# Patient Record
Sex: Female | Born: 2000 | Race: White | Hispanic: No | Marital: Single | Smoking: Current every day smoker
Health system: Southern US, Community
[De-identification: ages and names within clinical notes are randomized; demographics above are authoritative.]

## PROBLEM LIST (undated history)

## (undated) DIAGNOSIS — Z789 Other specified health status: Secondary | ICD-10-CM

## (undated) HISTORY — DX: Other specified health status: Z78.9

## (undated) HISTORY — PX: WISDOM TOOTH EXTRACTION: SHX21

---
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
## 2018-04-11 ENCOUNTER — Other Ambulatory Visit: Payer: Self-pay

## 2018-04-11 ENCOUNTER — Emergency Department: Payer: Medicaid Other

## 2018-04-11 ENCOUNTER — Encounter: Payer: Self-pay | Admitting: Emergency Medicine

## 2018-04-11 ENCOUNTER — Emergency Department
Admission: EM | Admit: 2018-04-11 | Discharge: 2018-04-12 | Disposition: A | Discharge status: Home / Self Care | Payer: Medicaid Other | Attending: Emergency Medicine | Admitting: Emergency Medicine

## 2018-04-11 DIAGNOSIS — M940 Chondrocostal junction syndrome [Tietze]: Secondary | ICD-10-CM

## 2018-04-11 DIAGNOSIS — R0789 Other chest pain: Secondary | ICD-10-CM | POA: Insufficient documentation

## 2018-04-11 DIAGNOSIS — R509 Fever, unspecified: Secondary | ICD-10-CM | POA: Diagnosis not present

## 2018-04-11 DIAGNOSIS — Z79899 Other long term (current) drug therapy: Secondary | ICD-10-CM | POA: Diagnosis not present

## 2018-04-11 DIAGNOSIS — R079 Chest pain, unspecified: Secondary | ICD-10-CM | POA: Diagnosis present

## 2018-04-11 LAB — CBC
HEMATOCRIT: 37 % (ref 36.0–46.0)
Hemoglobin: 13.4 g/dL (ref 12.0–15.0)
MCH: 30 pg (ref 26.0–34.0)
MCHC: 36.2 g/dL — AB (ref 30.0–36.0)
MCV: 83 fL (ref 80.0–100.0)
Platelets: 251 10*3/uL (ref 150–400)
RBC: 4.46 MIL/uL (ref 3.87–5.11)
RDW: 11.3 % — ABNORMAL LOW (ref 11.5–15.5)
WBC: 8.8 10*3/uL (ref 4.0–10.5)
nRBC: 0 % (ref 0.0–0.2)

## 2018-04-11 LAB — POCT PREGNANCY, URINE: Preg Test, Ur: NEGATIVE

## 2018-04-11 LAB — BASIC METABOLIC PANEL
ANION GAP: 9 (ref 5–15)
BUN: 13 mg/dL (ref 6–20)
CALCIUM: 9 mg/dL (ref 8.9–10.3)
CO2: 23 mmol/L (ref 22–32)
Chloride: 102 mmol/L (ref 98–111)
Creatinine, Ser: 0.76 mg/dL (ref 0.44–1.00)
GFR calc Af Amer: >60 mL/min (ref >60)
GFR calc non Af Amer: >60 mL/min (ref >60)
GLUCOSE: 107 mg/dL — AB (ref 70–99)
POTASSIUM: 3.7 mmol/L (ref 3.5–5.1)
Sodium: 134 mmol/L — ABNORMAL LOW (ref 135–145)

## 2018-04-11 LAB — TROPONIN I: Troponin I: <0.03 ng/mL

## 2018-04-11 MED ORDER — IOHEXOL 350 MG/ML SOLN
100.0000 mL | Freq: Once | INTRAVENOUS | Status: AC | PRN
  Administered 2018-04-11: 100 mL via INTRAVENOUS

## 2018-04-11 MED ORDER — SODIUM CHLORIDE 0.9 % IV BOLUS
1000.0000 mL | Freq: Once | INTRAVENOUS | Status: AC
  Administered 2018-04-12: 1000 mL via INTRAVENOUS

## 2018-04-11 MED ORDER — ACETAMINOPHEN 325 MG PO TABS
650.0000 mg | ORAL_TABLET | Freq: Once | ORAL | Status: AC | PRN
  Administered 2018-04-11: 650 mg via ORAL
  Filled 2018-04-11: qty 2

## 2018-04-11 MED ORDER — IOPAMIDOL (ISOVUE-370) INJECTION 76%: 100.0000 mL | Freq: Once | INTRAVENOUS | Status: DC | PRN

## 2018-04-11 NOTE — ED Notes (Signed)
Pt is going to medical imaging.   

## 2018-04-11 NOTE — ED Provider Notes (Signed)
[facility] Emergency Department Provider Note   ____________________________________________   First MD Initiated Contact with Patient 04/11/18 2305     (approximate)  I have reviewed the triage vital signs and the nursing notes.   HISTORY  Chief Complaint Chest Pain and Shortness of Breath    HPI Amanda Austin is a 17 y.o. female who presents to the ED from home with a chief complaint of chest pain and shortness of breath.  Patient reports left shoulder blade pain last night which radiated to her right shoulder.  Tonight she developed central chest pain worsened by deep breathing.  Today also developed low-grade fever.  Has had some coughing productive of yellow sputum.  Denies associated headache, vision changes, neck pain, abdominal pain, nausea, vomiting, dysuria, diarrhea.  Denies recent travel, trauma or OCP use.  Family history of blood clots.   Past medical history None  There are no active problems to display for this patient.   History reviewed. No pertinent surgical history.  Prior to Admission medications   Medication Sig Start Date End Date Taking? Authorizing Provider  naproxen (NAPROSYN) 500 MG tablet Take 1 tablet (500 mg total) by mouth 2 (two) times daily with a meal. 07/11/17   Triplett, Cari B, FNP  nitrofurantoin, macrocrystal-monohydrate, (MACROBID) 100 MG capsule Take 1 capsule (100 mg total) by mouth 2 (two) times daily. 12/05/17   Fisher, Roselyn Bering, PA-C  traMADol (ULTRAM) 50 MG tablet Take 1 tablet (50 mg total) by mouth every 6 (six) hours as needed. 07/11/17   Chinita Pester, FNP    Allergies Rocephin [ceftriaxone]  No family history on file.  Social History Social History   Tobacco Use  . Smoking status: Never Smoker  . Smokeless tobacco: Never Used  Substance Use Topics  . Alcohol use: Not on file  . Drug use: Not on file    Review of Systems  Constitutional: Positive for fever. Eyes: No visual  changes. ENT: No sore throat. Cardiovascular: Positive for chest pain. Respiratory: Positive for shortness of breath. Gastrointestinal: No abdominal pain.  No nausea, no vomiting.  No diarrhea.  No constipation. Genitourinary: Negative for dysuria. Musculoskeletal: Negative for back pain. Skin: Negative for rash. Neurological: Negative for headaches, focal weakness or numbness.   ____________________________________________   PHYSICAL EXAM:  VITAL SIGNS: ED Triage Vitals  Enc Vitals Group     BP 04/11/18 2239 127/67     Pulse Rate 04/11/18 2239 (!) 124     Resp 04/11/18 2239 20     Temp 04/11/18 2239 (!) 100.6 F (38.1 C)     Temp Source 04/11/18 2239 Oral     SpO2 04/11/18 2239 98 %     Weight 04/11/18 2233 127 lb (57.6 kg)     Height 04/11/18 2233 5\' 4"  (1.626 m)     Head Circumference --      Peak Flow --      Pain Score 04/11/18 2233 6     Pain Loc --      Pain Edu? --      Excl. in GC? --     Constitutional: Alert and oriented. Well appearing and in no acute distress.  Visiting and laughing with her mother. Eyes: Conjunctivae are normal. PERRL. EOMI. Head: Atraumatic. Nose: No congestion/rhinnorhea. Mouth/Throat: Mucous membranes are moist.  Oropharynx non-erythematous. Neck: No stridor.  Supple neck without meningismus. Hematological/Lymphatic/Immunilogical: No cervical lymphadenopathy. Cardiovascular: Tachycardic rate, regular rhythm. Grossly normal heart sounds.  Good peripheral circulation.  Respiratory: Normal respiratory effort.  No retractions. Lungs CTAB.  Anterior chest tender to palpation. Gastrointestinal: Soft and nontender to light or deep palpation. No distention. No abdominal bruits. No CVA tenderness. Musculoskeletal: No lower extremity tenderness nor edema.  No joint effusions. Neurologic:  Normal speech and language. No gross focal neurologic deficits are appreciated. No gait instability. Skin:  Skin is warm, dry and intact. No rash noted.  No  petechiae. Psychiatric: Mood and affect are normal. Speech and behavior are normal.  ____________________________________________   LABS (all labs ordered are listed, but only abnormal results are displayed)  Labs Reviewed  BASIC METABOLIC PANEL - Abnormal; Notable for the following components:      Result Value   Sodium 134 (*)    Glucose, Bld 107 (*)    All other components within normal limits  CBC - Abnormal; Notable for the following components:   MCHC 36.2 (*)    RDW 11.3 (*)    All other components within normal limits  URINE DRUG SCREEN, QUALITATIVE ([facility] ONLY) - Abnormal; Notable for the following components:   Cannabinoid 50 Ng, Ur [place] POSITIVE (*)    All other components within normal limits  TROPONIN I  POC URINE PREG, ED  POCT PREGNANCY, URINE   ____________________________________________  EKG  ED ECG REPORT I, Capone Schwinn J, the attending physician, personally viewed and interpreted this ECG.   Date: 04/11/2018  EKG Time: 2238  Rate: 124  Rhythm: sinus tachycardia  Axis: Normal  Intervals:none  ST&T Change: Nonspecific No old EKG for comparison ____________________________________________  RADIOLOGY  ED MD interpretation: No active cardiopulmonary process; no PE  Official radiology report(s): Dg Chest 2 View  Result Date: 04/11/2018 CLINICAL DATA:  Left-sided rib pain last night and as well as left shoulder pain. EXAM: CHEST - 2 VIEW COMPARISON:  None. FINDINGS: The heart size and mediastinal contours are within normal limits. Both lungs are clear. No pulmonary consolidation, edema or pneumothorax. No pleural effusion. The visualized skeletal structures are unremarkable. IMPRESSION: No active cardiopulmonary disease. Electronically Signed   By: Tollie Eth M.D.   On: 04/11/2018 23:44   Ct Angio Chest Pe W/cm &/or Wo Cm  Result Date: 04/12/2018 CLINICAL DATA:  Left rib pain and left shoulder pain. Chest pain. Fever. EXAM: CT ANGIOGRAPHY CHEST WITH  CONTRAST TECHNIQUE: Multidetector CT imaging of the chest was performed using the standard protocol during bolus administration of intravenous contrast. Multiplanar CT image reconstructions and MIPs were obtained to evaluate the vascular anatomy. CONTRAST:  OMNIPAQUE IOHEXOL 350 MG/ML SOLN COMPARISON:  Chest radiograph 04/11/2018 FINDINGS: Cardiovascular: No filling defect is identified in the pulmonary arterial tree to suggest pulmonary embolus. No acute thoracic aortic findings. Mediastinum/Nodes: Anterior mediastinal density favoring residual thymic tissue. Left axillary node 0.8 cm in short axis, image 34/4. Right axillary node 0.7 cm in short axis, image 34/4. Right lower neck level IV lymph node 0.6 cm in short axis, image 6/4. No pathologic adenopathy is observed. Lungs/Pleura: Unremarkable Upper Abdomen: Potential prominence the upper spleen. Musculoskeletal: Unremarkable Review of the MIP images confirms the above findings. IMPRESSION: 1. No filling defect is identified in the pulmonary arterial tree to suggest pulmonary embolus. 2. A cause for patient's pleuritic left chest pain is not identified. 3. Borderline prominence the upper splenic contour. I recommend correlate palpation of the spleen to exclude splenomegaly. 4. Anterior mediastinal density in this case is most compatible with residual thymic tissue. 5. Small bilateral axillary lymph nodes are relatively symmetric. Electronically Signed  By: Gaylyn Rong M.D.   On: 04/12/2018 00:44    ____________________________________________   PROCEDURES  Procedure(s) performed: None  Procedures  Critical Care performed: No  ____________________________________________   INITIAL IMPRESSION / ASSESSMENT AND PLAN / ED COURSE  As part of my medical decision making, I reviewed the following data within the electronic MEDICAL RECORD NUMBER History obtained from family, Nursing notes reviewed and incorporated, Labs reviewed, EKG  interpreted, Old chart reviewed, Radiograph reviewed and Notes from prior ED visits   17 year old female who presents with low-grade fever, chest pain and shortness of breath. Differential diagnosis includes, but is not limited to, ACS, aortic dissection, pulmonary embolism, cardiac tamponade, pneumothorax, pneumonia, pericarditis, myocarditis, GI-related causes including esophagitis/gastritis, and musculoskeletal chest wall pain.    Laboratory and chest x-ray results unremarkable.  Given her family history of blood clots, will proceed with CT angios of her chest to evaluate for pulmonary embolus.  Tylenol given prior to my examination.  Clinical Course as of Apr 12 118  Fri Apr 12, 2018  0117 Patient voices no complaints of chest or back pain.  No splenomegaly on repeat abdominal exam.  Updated patient and her mother on CT imaging result.  Do not feel patient warrants repeat troponin as her symptoms have been ongoing for more than 24 hours.  Will discharge home on NSAIDs, Medrol Dosepak and patient will follow-up closely with her PCP.  Strict return precautions given.  Both verbalize understanding and agree with plan of care.   [JS]    Clinical Course User Index [JS] Irean Hong, MD     ____________________________________________   FINAL CLINICAL IMPRESSION(S) / ED DIAGNOSES  Final diagnoses:  Fever, unspecified fever cause  Atypical chest pain  Costochondritis     ED Discharge Orders    None       Note:  This document was prepared using Dragon voice recognition software and may include unintentional dictation errors.    Irean Hong, MD 04/12/18 [phone]

## 2018-04-11 NOTE — ED Triage Notes (Signed)
Patient reports left sided rib pain last night, as well as left sided shoulder pain. States pain traveled to right shoulder. Today developed chest pain, particularly with breathing. Patient reports fever earlier today (highest 100.5).

## 2018-04-12 LAB — URINE DRUG SCREEN, QUALITATIVE (ARMC ONLY)
Amphetamines, Ur Screen: NOT DETECTED
BARBITURATES, UR SCREEN: NOT DETECTED
BENZODIAZEPINE, UR SCRN: NOT DETECTED
CANNABINOID 50 NG, UR ~~LOC~~: POSITIVE — AB
Cocaine Metabolite,Ur ~~LOC~~: NOT DETECTED
MDMA (Ecstasy)Ur Screen: NOT DETECTED
Methadone Scn, Ur: NOT DETECTED
Opiate, Ur Screen: NOT DETECTED
Phencyclidine (PCP) Ur S: NOT DETECTED
TRICYCLIC, UR SCREEN: NOT DETECTED

## 2018-04-12 MED ORDER — METHYLPREDNISOLONE 4 MG PO TBPK: ORAL_TABLET | ORAL | 0 refills | Status: DC

## 2018-04-12 MED ORDER — IBUPROFEN 600 MG PO TABS: 600.0000 mg | ORAL_TABLET | Freq: Three times a day (TID) | ORAL | 0 refills | Status: DC | PRN

## 2018-04-12 NOTE — Discharge Instructions (Addendum)
1.  Take Medrol Dosepak as prescribed. 2.  Take Motrin as needed for chest discomfort. 3.  Apply moist heat to chest wall several times daily for comfort. 4.  Return to the ER for worsening symptoms, persistent vomiting, difficulty breathing or other concerns.

## 2018-07-26 ENCOUNTER — Emergency Department
Admission: EM | Admit: 2018-07-26 | Discharge: 2018-07-26 | Disposition: A | Discharge status: Home / Self Care | Payer: Medicaid Other | Attending: Emergency Medicine | Admitting: Emergency Medicine

## 2018-07-26 ENCOUNTER — Encounter: Payer: Self-pay | Admitting: Emergency Medicine

## 2018-07-26 ENCOUNTER — Other Ambulatory Visit: Payer: Self-pay

## 2018-07-26 ENCOUNTER — Emergency Department: Payer: Medicaid Other

## 2018-07-26 DIAGNOSIS — O200 Threatened abortion: Secondary | ICD-10-CM | POA: Diagnosis not present

## 2018-07-26 DIAGNOSIS — Z87891 Personal history of nicotine dependence: Secondary | ICD-10-CM | POA: Diagnosis not present

## 2018-07-26 DIAGNOSIS — Z79899 Other long term (current) drug therapy: Secondary | ICD-10-CM | POA: Insufficient documentation

## 2018-07-26 DIAGNOSIS — Z3A00 Weeks of gestation of pregnancy not specified: Secondary | ICD-10-CM | POA: Diagnosis not present

## 2018-07-26 LAB — POCT PREGNANCY, URINE: Preg Test, Ur: NEGATIVE

## 2018-07-26 LAB — HCG, QUANTITATIVE, PREGNANCY: hCG, Beta Chain, Quant, S: 11 m[IU]/mL — ABNORMAL HIGH (ref <5)

## 2018-07-26 LAB — ABO/RH: ABO/RH(D): A POS

## 2018-07-26 MED ORDER — BUTALBITAL-APAP-CAFFEINE 50-325-40 MG PO TABS
1.0000 | ORAL_TABLET | Freq: Once | ORAL | Status: AC
  Administered 2018-07-26: 1 via ORAL
  Filled 2018-07-26: qty 1

## 2018-07-26 NOTE — Discharge Instructions (Addendum)
As we discussed we think it is likely that you did have a miscarriage. However it is important that you follow up with an ob/gyn provider to have your blood level rechecked. Please seek medical care for any heavy prolonged bleeding, severe abdominal pain, shortness of breath or any other new or concerning symptoms.

## 2018-07-26 NOTE — ED Triage Notes (Signed)
Patient reports being pregnant at this time. Was at appointment this AM to see how far along she was and started bleeding. Patient left and came to ER

## 2018-07-26 NOTE — ED Notes (Signed)
Lab reported it should be about 5 more minutes before HCG pregnancy results

## 2018-07-26 NOTE — ED Provider Notes (Signed)
[facility] Emergency Department Provider Note   ____________________________________________   I have reviewed the triage vital signs and the nursing notes.   HISTORY  Chief Complaint Threatened Miscarriage   History limited by: Not Limited   HPI Amanda Austin is a 18 y.o. female who presents to the emergency department today because of concerns for vaginal bleeding in the setting of early pregnancy.  Patient states that she had her first positive pregnancy test 4 days ago.  She states she took them because she started having some abnormal feelings for her.  She did have some spotting although today when she went to an appointment to firm the pregnancy she started having heavier bleeding.  She describes some darker blood as well.  This has been accompanied by some suprapubic and low back pain. She states her last period was roughly 1 month ago.   Per medical record review patient has a history of pelvic pain  History reviewed. No pertinent past medical history.  There are no active problems to display for this patient.   History reviewed. No pertinent surgical history.  Prior to Admission medications   Medication Sig Start Date End Date Taking? Authorizing Provider  ibuprofen (ADVIL,MOTRIN) 600 MG tablet Take 1 tablet (600 mg total) by mouth every 8 (eight) hours as needed. 04/12/18   Irean Hong, MD  methylPREDNISolone (MEDROL DOSEPAK) 4 MG TBPK tablet Take as directed 04/12/18   Irean Hong, MD  naproxen (NAPROSYN) 500 MG tablet Take 1 tablet (500 mg total) by mouth 2 (two) times daily with a meal. 07/11/17   Triplett, Cari B, FNP  nitrofurantoin, macrocrystal-monohydrate, (MACROBID) 100 MG capsule Take 1 capsule (100 mg total) by mouth 2 (two) times daily. 12/05/17   Fisher, Roselyn Bering, PA-C  traMADol (ULTRAM) 50 MG tablet Take 1 tablet (50 mg total) by mouth every 6 (six) hours as needed. 07/11/17   Triplett, Kasandra Knudsen, FNP    Allergies Rocephin  [ceftriaxone]  History reviewed. No pertinent family history.  Social History Social History   Tobacco Use  . Smoking status: Former Games developer  . Smokeless tobacco: Never Used  Substance Use Topics  . Alcohol use: Never    Frequency: Never  . Drug use: Never    Review of Systems Constitutional: No fever/chills Eyes: No visual changes. ENT: No sore throat. Cardiovascular: Denies chest pain. Respiratory: Denies shortness of breath. Gastrointestinal: Positive for lower abdominal pain. Genitourinary: Positive for vaginal bleeding.  Musculoskeletal: Negative for back pain. Skin: Negative for rash. Neurological: Negative for headaches, focal weakness or numbness.  ____________________________________________   PHYSICAL EXAM:  VITAL SIGNS: ED Triage Vitals  Enc Vitals Group     BP 07/26/18 1152 133/90     Pulse Rate 07/26/18 1152 71     Resp 07/26/18 1152 20     Temp 07/26/18 1152 97.8 F (36.6 C)     Temp Source 07/26/18 1152 Oral     SpO2 07/26/18 1152 100 %     Weight 07/26/18 1155 116 lb (52.6 kg)     Height 07/26/18 1155 5\' 4"  (1.626 m)     Head Circumference --      Peak Flow --      Pain Score 07/26/18 1155 3   Constitutional: Alert and oriented.  Eyes: Conjunctivae are normal.  ENT      Head: Normocephalic and atraumatic.      Nose: No congestion/rhinnorhea.      Mouth/Throat: Mucous membranes are moist.  Neck: No stridor. Hematological/Lymphatic/Immunilogical: No cervical lymphadenopathy. Cardiovascular: Normal rate, regular rhythm.  No murmurs, rubs, or gallops.  Respiratory: Normal respiratory effort without tachypnea nor retractions. Breath sounds are clear and equal bilaterally. No wheezes/rales/rhonchi. Gastrointestinal: Soft and tender in the suprapubic region. Genitourinary: Deferred Musculoskeletal: Normal range of motion in all extremities. No lower extremity edema. Neurologic:  Normal speech and language. No gross focal neurologic deficits  are appreciated.  Skin:  Skin is warm, dry and intact. No rash noted. Psychiatric: Mood and affect are normal. Speech and behavior are normal. Patient exhibits appropriate insight and judgment.  ____________________________________________    LABS (pertinent positives/negatives)  hcg 11 ABO A POS  ____________________________________________   EKG  None  ____________________________________________    RADIOLOGY  None  ____________________________________________   PROCEDURES  Procedures  ____________________________________________   INITIAL IMPRESSION / ASSESSMENT AND PLAN / ED COURSE  Pertinent labs & imaging results that were available during my care of the patient were reviewed by me and considered in my medical decision making (see chart for details).   Patient presented to the emergency department today because of concerns for vaginal bleeding in the setting of early pregnancy.  Patient's blood work today had a beta hCG of 11.  Patient states she did have a positive urine pregnancy test 4 days ago.  At this point given my belief that the beta-hCG is likely falling do of concerns the patient did suffer a miscarriage.  I discussed this with the patient but also discussed with patient that I cannot be 100% sure given that we have only checked her blood today.  I discussed with patient would be important for her to get beta-hCG levels rechecked.  Additionally discussed with patient possibility of getting ultrasound.  Discussed with patient that given her believe that it is likely very early pregnancy did not feel that ultrasound would be of special high yield.  I have low suspicion for ectopic pregnancy although I did discuss this with the patient.  We did offer to get ultrasound however she stated she does not want anyone touching her at this time.  I think it is reasonable for the patient to defer at this time.  Again discussed with patient portance of OB/GYN  follow-up.  ____________________________________________   FINAL CLINICAL IMPRESSION(S) / ED DIAGNOSES  Final diagnoses:  Threatened miscarriage     Note: This dictation was prepared with Dragon dictation. Any transcriptional errors that result from this process are unintentional     Phineas Semen, MD 07/26/18 1540

## 2018-07-26 NOTE — ED Notes (Signed)

## 2018-08-17 ENCOUNTER — Emergency Department
Admission: EM | Admit: 2018-08-17 | Discharge: 2018-08-17 | Disposition: A | Discharge status: Home / Self Care | Payer: Medicaid Other | Attending: Emergency Medicine | Admitting: Emergency Medicine

## 2018-08-17 ENCOUNTER — Other Ambulatory Visit: Payer: Self-pay

## 2018-08-17 ENCOUNTER — Encounter: Payer: Self-pay | Admitting: Emergency Medicine

## 2018-08-17 ENCOUNTER — Emergency Department: Payer: Medicaid Other

## 2018-08-17 DIAGNOSIS — Z79899 Other long term (current) drug therapy: Secondary | ICD-10-CM | POA: Insufficient documentation

## 2018-08-17 DIAGNOSIS — Y9389 Activity, other specified: Secondary | ICD-10-CM | POA: Diagnosis not present

## 2018-08-17 DIAGNOSIS — W010XXA Fall on same level from slipping, tripping and stumbling without subsequent striking against object, initial encounter: Secondary | ICD-10-CM | POA: Insufficient documentation

## 2018-08-17 DIAGNOSIS — Y998 Other external cause status: Secondary | ICD-10-CM | POA: Diagnosis not present

## 2018-08-17 DIAGNOSIS — Y929 Unspecified place or not applicable: Secondary | ICD-10-CM | POA: Insufficient documentation

## 2018-08-17 DIAGNOSIS — S3992XA Unspecified injury of lower back, initial encounter: Secondary | ICD-10-CM | POA: Diagnosis present

## 2018-08-17 DIAGNOSIS — S300XXA Contusion of lower back and pelvis, initial encounter: Secondary | ICD-10-CM | POA: Insufficient documentation

## 2018-08-17 DIAGNOSIS — Z87891 Personal history of nicotine dependence: Secondary | ICD-10-CM | POA: Diagnosis not present

## 2018-08-17 LAB — POCT PREGNANCY, URINE: Preg Test, Ur: NEGATIVE

## 2018-08-17 MED ORDER — TRAMADOL HCL 50 MG PO TABS: 50.0000 mg | ORAL_TABLET | Freq: Four times a day (QID) | ORAL | 0 refills | Status: DC | PRN

## 2018-08-17 MED ORDER — LIDOCAINE 5 % EX PTCH: 1.0000 | MEDICATED_PATCH | Freq: Two times a day (BID) | CUTANEOUS | 0 refills | Status: DC

## 2018-08-17 MED ORDER — IBUPROFEN 600 MG PO TABS: 600.0000 mg | ORAL_TABLET | Freq: Three times a day (TID) | ORAL | 0 refills | Status: DC | PRN

## 2018-08-17 MED ORDER — LIDOCAINE 5 % EX PTCH
1.0000 | MEDICATED_PATCH | CUTANEOUS | Status: DC
  Administered 2018-08-17: 1 via TRANSDERMAL
  Filled 2018-08-17: qty 1

## 2018-08-17 NOTE — ED Notes (Signed)
Lidocaine patch applied to coccyx area. Pt reported almost immediate improvement in pain. Pain still rated at an 8 but she states that she is able to walk upright.

## 2018-08-17 NOTE — ED Provider Notes (Signed)
[facility] Emergency Department Provider Note   ____________________________________________   First MD Initiated Contact with Patient 08/17/18 1149     (approximate)  I have reviewed the triage vital signs and the nursing notes.   HISTORY  Chief Complaint Fall    HPI Amanda Austin is a 18 y.o. female patient states tailbone pain secondary to a fall a few days ago.  Cannot give exact date that she fell.  Patient state initially pain only when sitting but has not progressed to even when standing or walking the pain is present.  Patient denies bladder bowel dysfunction.  Patient rates pain as a 9/10.  Patient described pain is "achy".  No palliative measure for complaint.  History reviewed. No pertinent past medical history.  There are no active problems to display for this patient.   History reviewed. No pertinent surgical history.  Prior to Admission medications   Medication Sig Start Date End Date Taking? Authorizing Provider  ibuprofen (ADVIL,MOTRIN) 600 MG tablet Take 1 tablet (600 mg total) by mouth every 8 (eight) hours as needed. 04/12/18   Irean Hong, MD  ibuprofen (ADVIL,MOTRIN) 600 MG tablet Take 1 tablet (600 mg total) by mouth every 8 (eight) hours as needed. 08/17/18   Joni Reining, PA-C  lidocaine (LIDODERM) 5 % Place 1 patch onto the skin every 12 (twelve) hours. Remove & Discard patch within 12 hours or as directed by MD 08/17/18 08/17/19  Joni Reining, PA-C  methylPREDNISolone (MEDROL DOSEPAK) 4 MG TBPK tablet Take as directed 04/12/18   Irean Hong, MD  naproxen (NAPROSYN) 500 MG tablet Take 1 tablet (500 mg total) by mouth 2 (two) times daily with a meal. 07/11/17   Triplett, Cari B, FNP  nitrofurantoin, macrocrystal-monohydrate, (MACROBID) 100 MG capsule Take 1 capsule (100 mg total) by mouth 2 (two) times daily. 12/05/17   Fisher, Roselyn Bering, PA-C  traMADol (ULTRAM) 50 MG tablet Take 1 tablet (50 mg total) by mouth every 6 (six)  hours as needed. 07/11/17   Triplett, Rulon Eisenmenger B, FNP  traMADol (ULTRAM) 50 MG tablet Take 1 tablet (50 mg total) by mouth every 6 (six) hours as needed. 08/17/18 08/17/19  Joni Reining, PA-C    Allergies Rocephin [ceftriaxone]  No family history on file.  Social History Social History   Tobacco Use  . Smoking status: Former Games developer  . Smokeless tobacco: Never Used  Substance Use Topics  . Alcohol use: Never    Frequency: Never  . Drug use: Never    Review of Systems Constitutional: No fever/chills Eyes: No visual changes. ENT: No sore throat. Cardiovascular: Denies chest pain. Respiratory: Denies shortness of breath. Gastrointestinal: No abdominal pain.  No nausea, no vomiting.  No diarrhea.  No constipation. Genitourinary: Negative for dysuria. Musculoskeletal: Coccyx pain Skin: Negative for rash. Neurological: Negative for headaches, focal weakness or numbness. Allergic/Immunilogical: Rocephin ____________________________________________   PHYSICAL EXAM:  VITAL SIGNS: ED Triage Vitals  Enc Vitals Group     BP 08/17/18 1129 136/62     Pulse Rate 08/17/18 1129 82     Resp 08/17/18 1129 18     Temp 08/17/18 1129 98.2 F (36.8 C)     Temp Source 08/17/18 1129 Oral     SpO2 08/17/18 1129 100 %     Weight --      Height --      Head Circumference --      Peak Flow --  Pain Score 08/17/18 1137 9     Pain Loc --      Pain Edu? --      Excl. in GC? --    Constitutional: Alert and oriented. Well appearing and in no acute distress. Cardiovascular: Normal rate, regular rhythm. Grossly normal heart sounds.  Good peripheral circulation. Respiratory: Normal respiratory effort.  No retractions. Lungs CTAB. Musculoskeletal: No obvious lumbar deformity.  Patient has moderate guarding palpation L4-S1.  Neurologic:  Normal speech and language. No gross focal neurologic deficits are appreciated. No gait instability. Skin:  Skin is warm, dry and intact. No rash  noted. Psychiatric: Mood and affect are normal. Speech and behavior are normal.  ____________________________________________   LABS (all labs ordered are listed, but only abnormal results are displayed)  Labs Reviewed  POC URINE PREG, ED  POCT PREGNANCY, URINE   ____________________________________________  EKG   ____________________________________________  RADIOLOGY  ED MD interpretation:    Official radiology report(s): Dg Sacrum/coccyx  Result Date: 08/17/2018 CLINICAL DATA:  Larey Seat on hardwood floor landing on buttocks 4-5 days ago. Persistent coccygeal pain. EXAM: SACRUM AND COCCYX - 2+ VIEW COMPARISON:  None. FINDINGS: No acute fracture is identified. The SI joints are approximated. A moderate amount of stool is partially visualized in the colon. IMPRESSION: No acute osseous abnormality identified. Electronically Signed   By: Sebastian Ache M.D.   On: 08/17/2018 13:18    ____________________________________________   PROCEDURES  Procedure(s) performed: None  Procedures  Critical Care performed: No  ____________________________________________   INITIAL IMPRESSION / ASSESSMENT AND PLAN / ED COURSE  As part of my medical decision making, I reviewed the following data within the electronic MEDICAL RECORD NUMBER     Patient presents with coccyx pain secondary to contusion.  Discussed negative x-ray findings with patient.  Patient given discharge care instruction advised take medication directed.  Patient advised follow-up PCP as needed.      ____________________________________________   FINAL CLINICAL IMPRESSION(S) / ED DIAGNOSES  Final diagnoses:  Coccyx contusion, initial encounter     ED Discharge Orders         Ordered    traMADol (ULTRAM) 50 MG tablet  Every 6 hours PRN     08/17/18 1326    ibuprofen (ADVIL,MOTRIN) 600 MG tablet  Every 8 hours PRN     08/17/18 1326    lidocaine (LIDODERM) 5 %  Every 12 hours     08/17/18 1326            Note:  This document was prepared using Dragon voice recognition software and may include unintentional dictation errors.    Joni Reining, PA-C 08/17/18 1330    Dionne Bucy, MD 08/17/18 1520

## 2018-08-17 NOTE — Discharge Instructions (Addendum)
Follow discharge care instruction take medication as directed. °

## 2018-08-17 NOTE — ED Triage Notes (Signed)
Pt to ED via POV stating that she thinks that she broke her tailbone. Pt states that she fell earlier this week, unsure what day but the pain has continued to get worse. Pt in NAD at this time.

## 2018-08-17 NOTE — ED Notes (Signed)
Pt presents today post fall. Pt states that she feel earlier this week. Pt states the pain is the same sitting or laying down. Pt states the pain was just when sitting earlier in the week and now has changed.

## 2018-11-25 IMAGING — CR DG CHEST 2V
1 series · 2 of 2 positions shown · non-contrast
Comparison: None.

CLINICAL DATA: Left-sided rib pain last night and as well as left
shoulder pain.

EXAM:
CHEST - 2 VIEW

[Series 1: dg chest 2 view · 0.14mm/px · 2 of 2 slices shown]
[im 1/2]
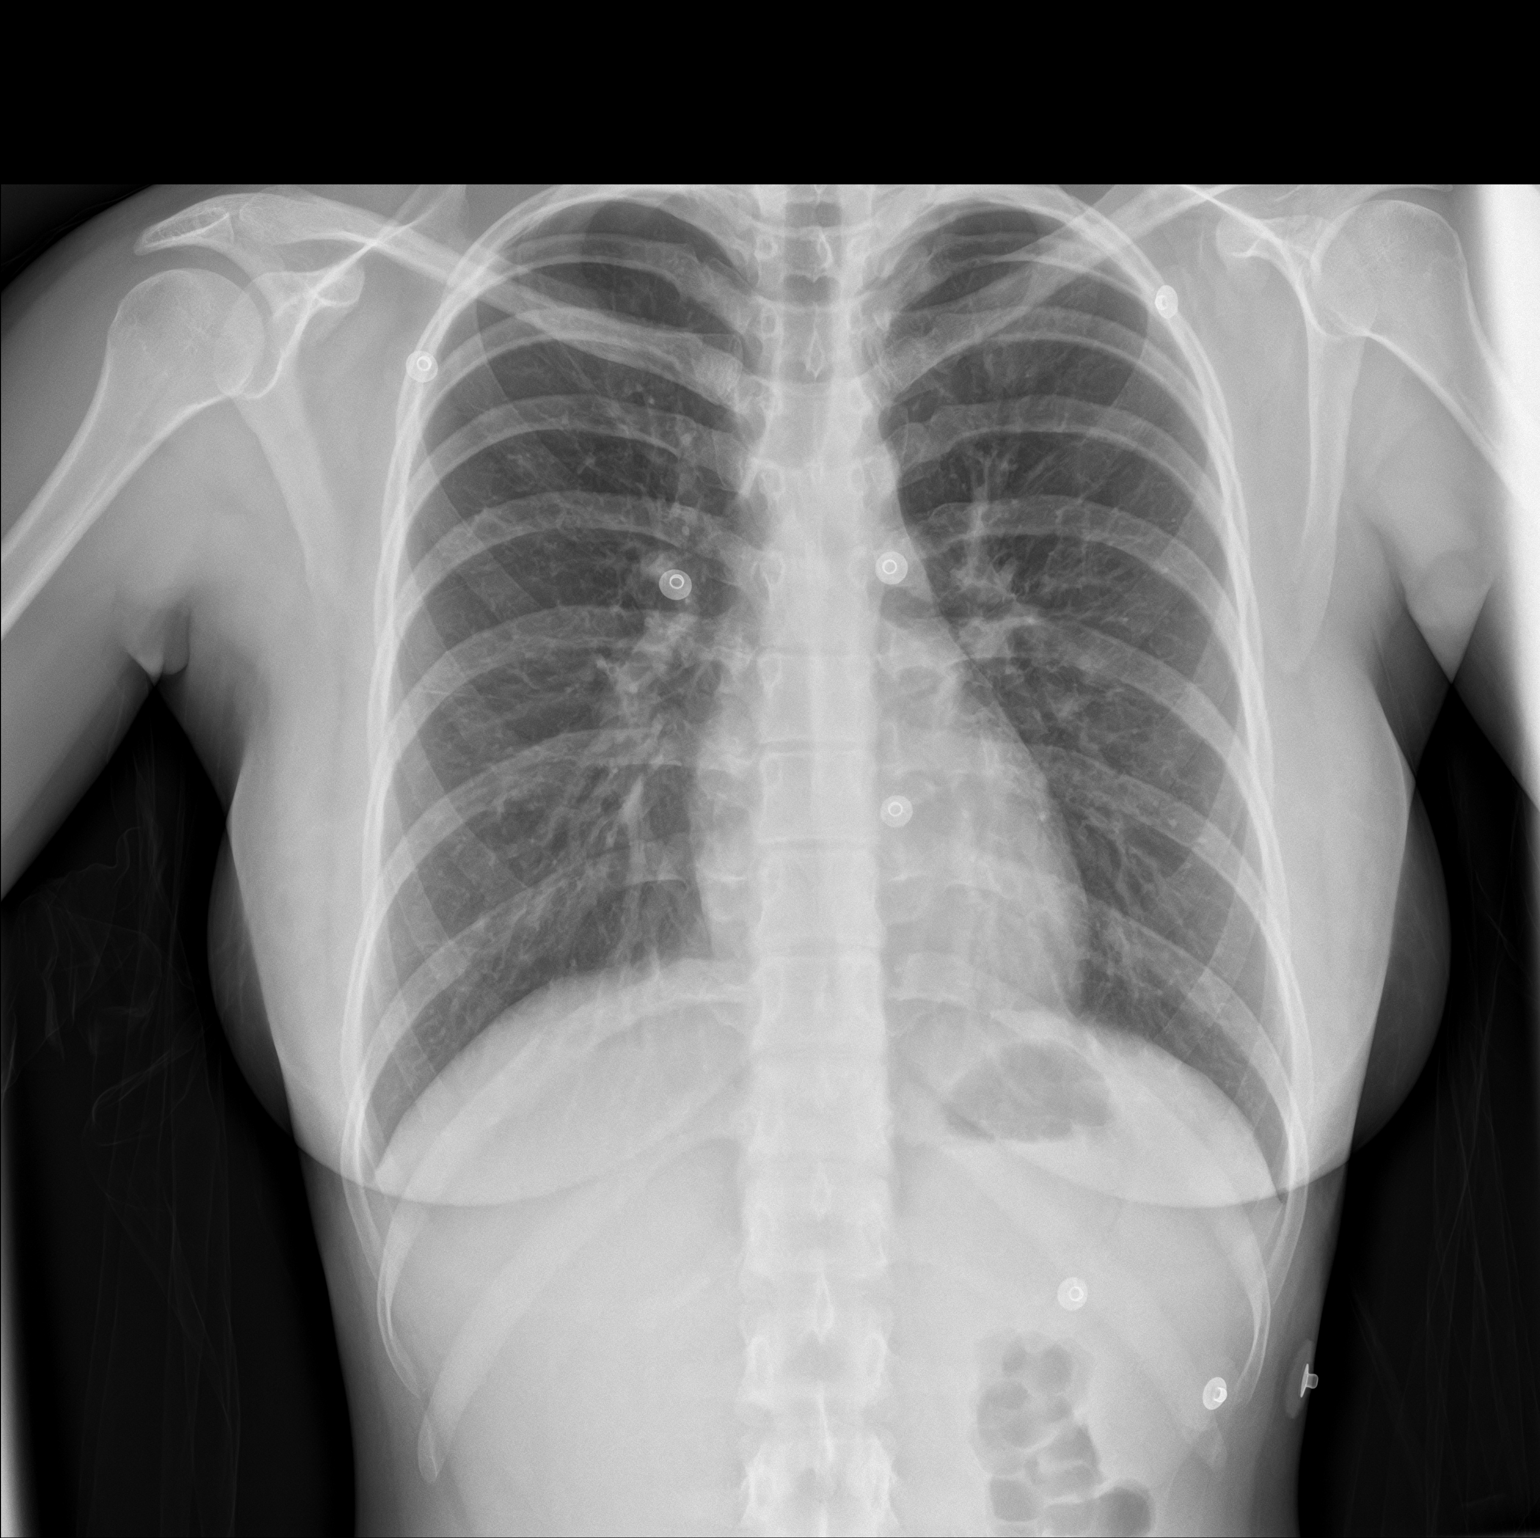
[im 2/2]
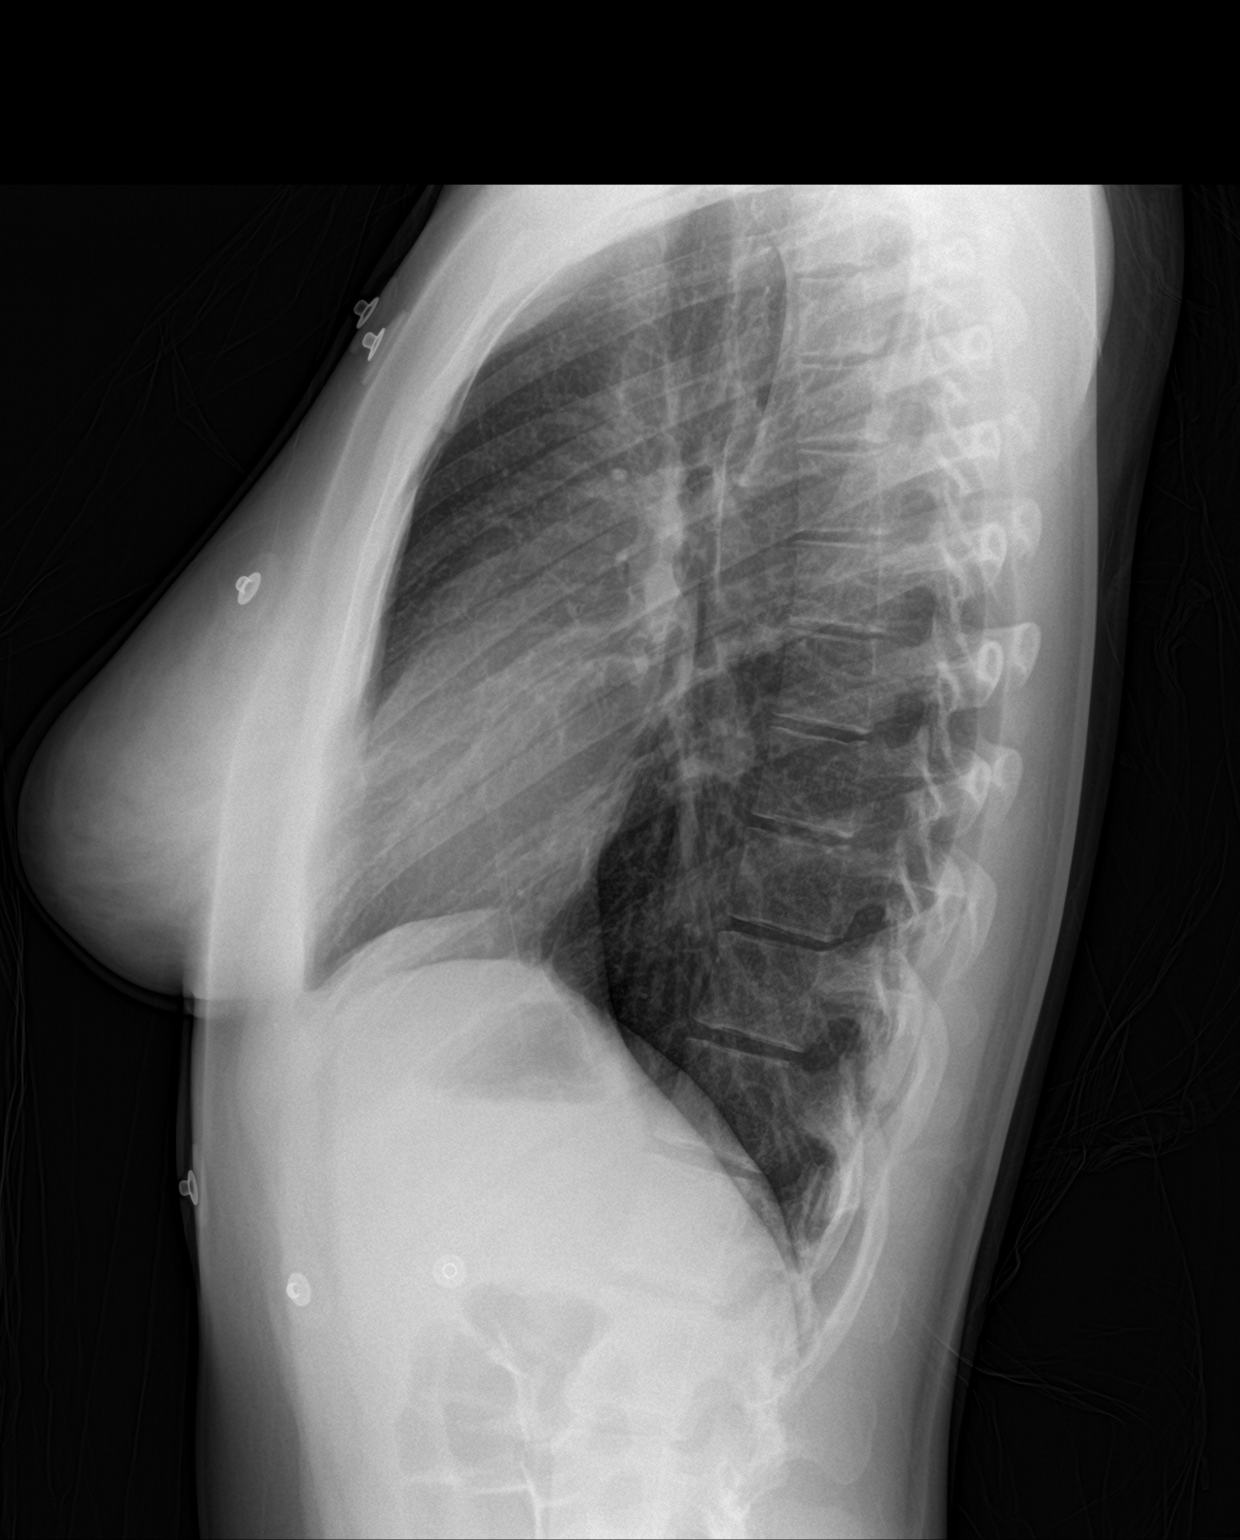

[2 of 2 positions shown; findings below may reference images not displayed]

FINDINGS: The heart size and mediastinal contours are within normal limits.
Both lungs are clear. No pulmonary consolidation, edema or
pneumothorax. No pleural effusion. The visualized skeletal
structures are unremarkable.
IMPRESSION: No active cardiopulmonary disease.

## 2019-04-02 IMAGING — CR DG SACRUM/COCCYX 2+V
3 series · 3 of 3 positions shown · non-contrast
Comparison: None.

CLINICAL DATA: Fell on hardwood floor landing on buttocks 4-5 days
ago. Persistent coccygeal pain.

EXAM:
SACRUM AND COCCYX - 2+ VIEW

[coccyx ap]
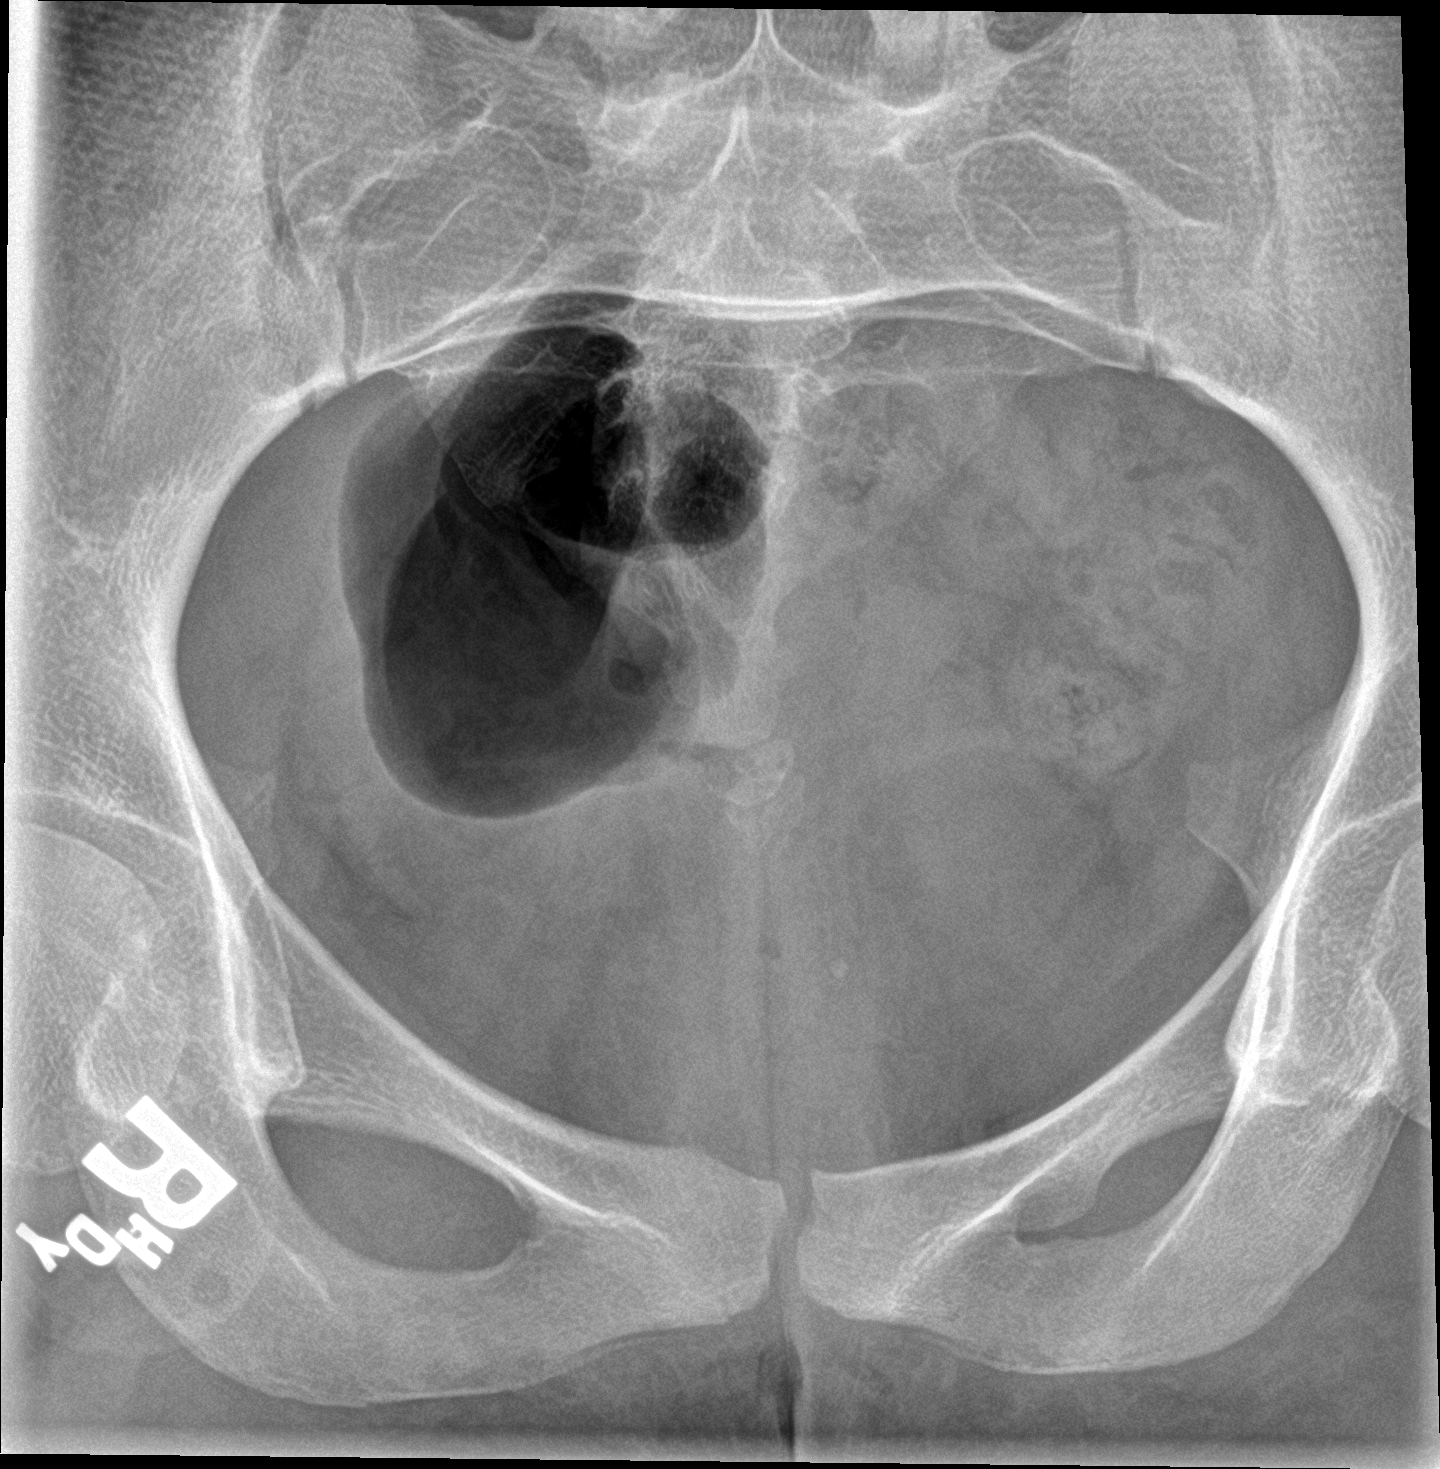

[sacrum ap]
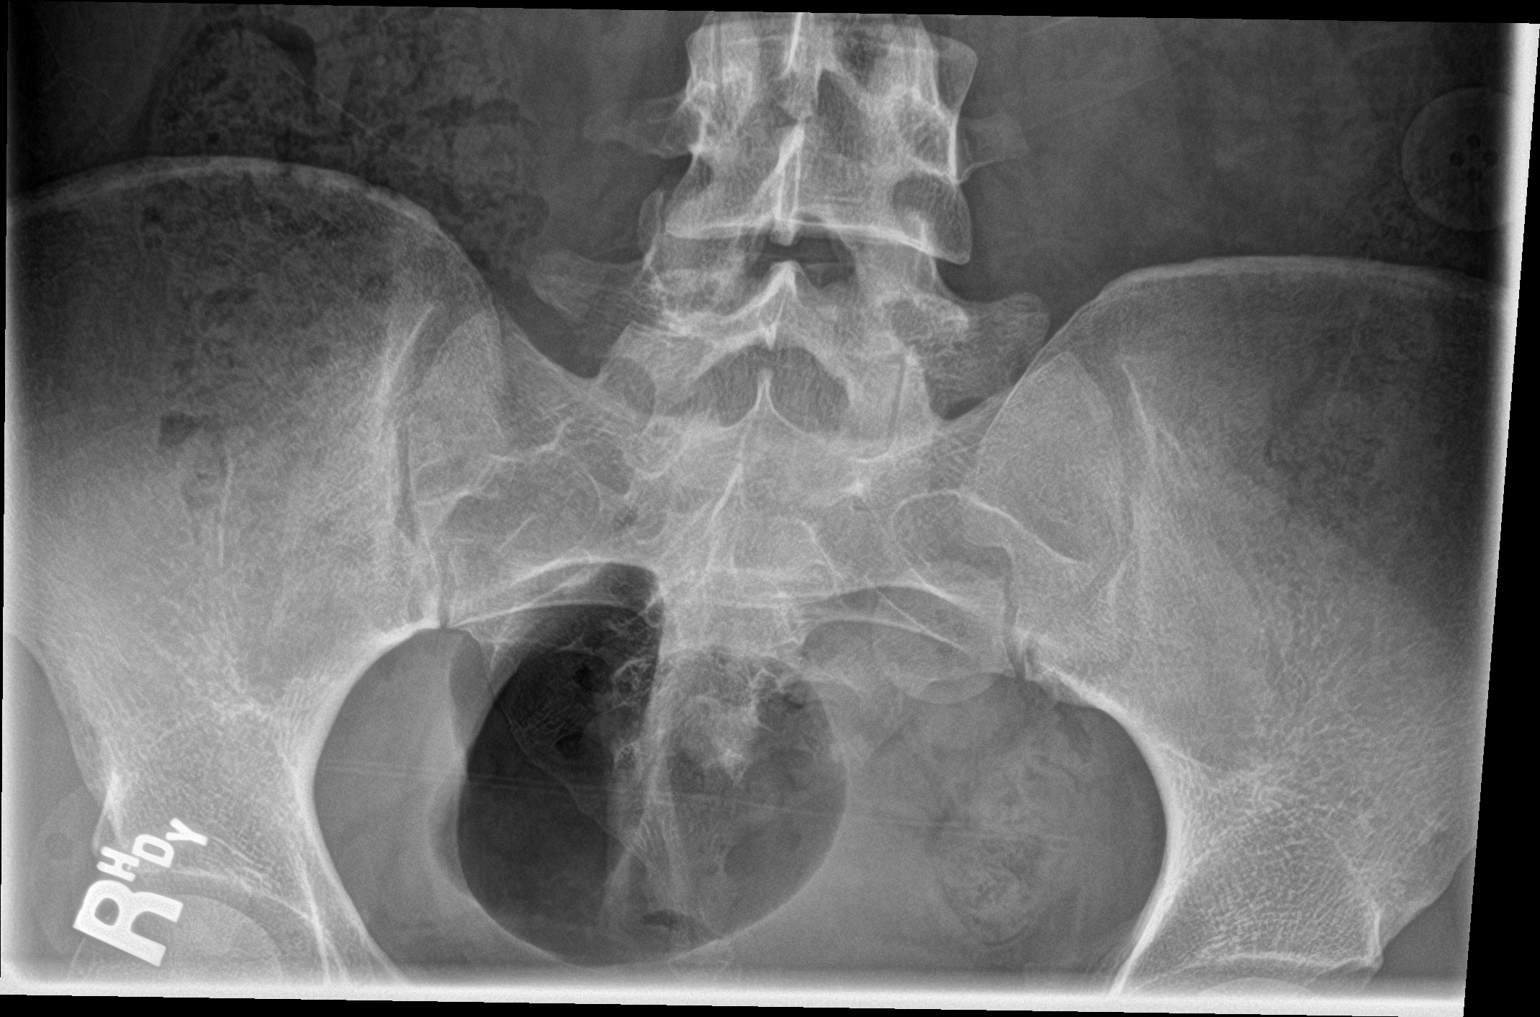

[sacrum lat]
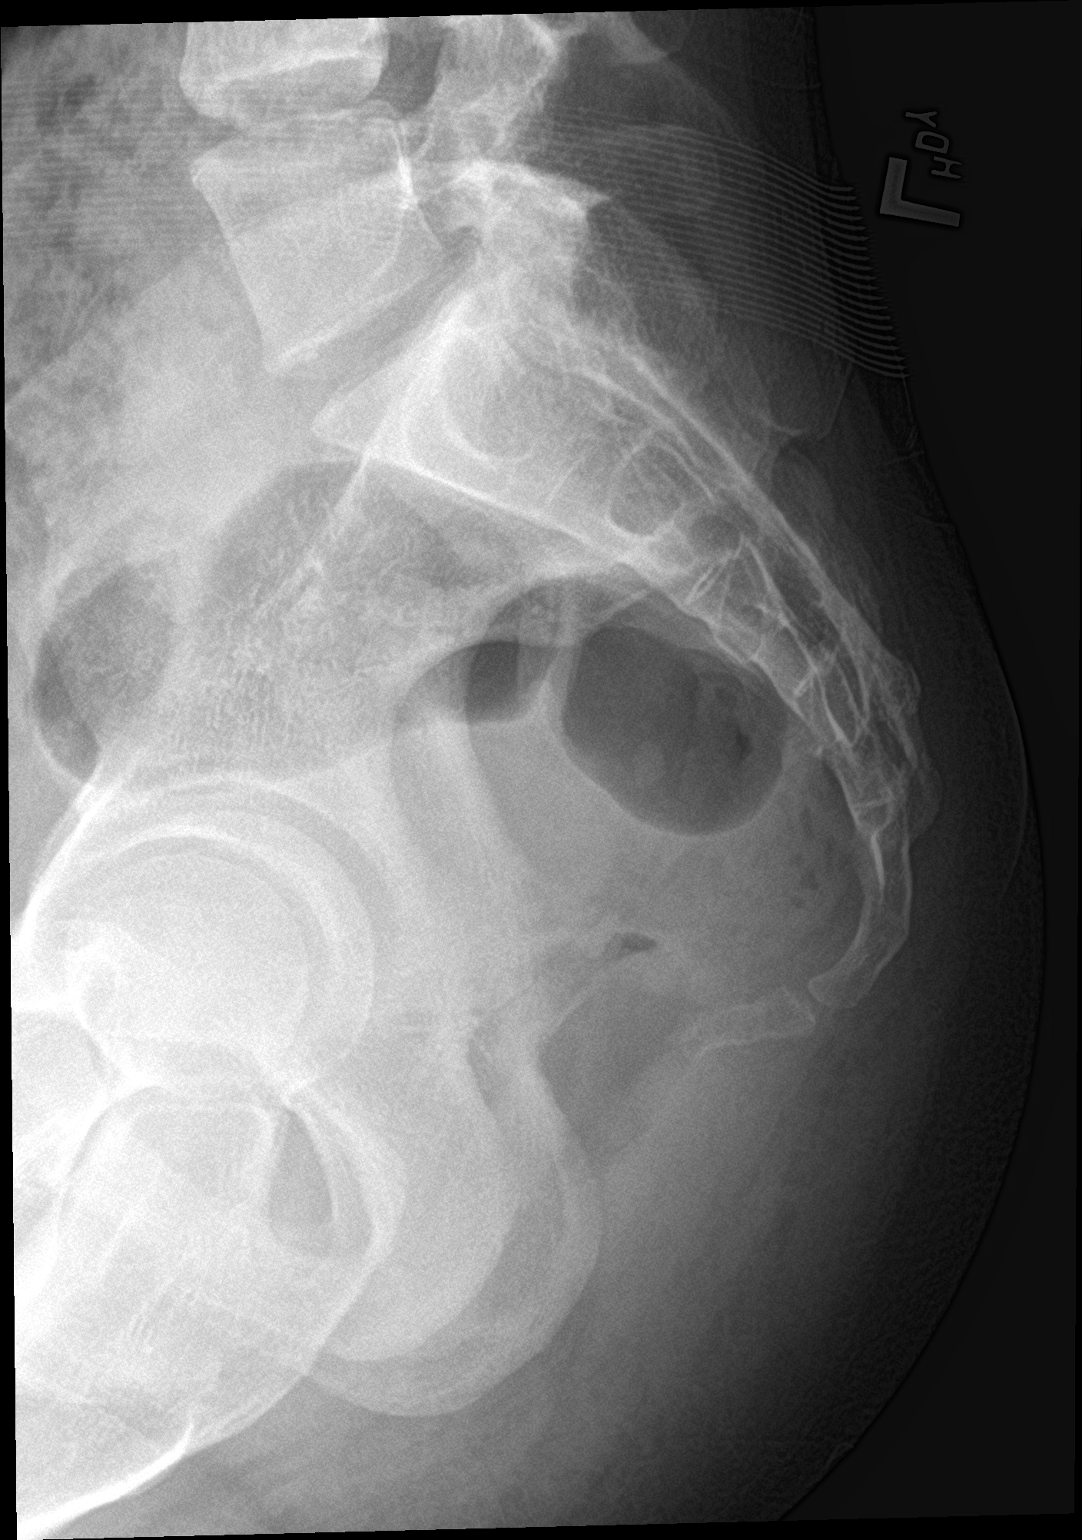

[3 of 3 positions shown; findings below may reference images not displayed]

FINDINGS: No acute fracture is identified. The SI joints are approximated. A
moderate amount of stool is partially visualized in the colon.
IMPRESSION: No acute osseous abnormality identified.

## 2019-07-08 ENCOUNTER — Other Ambulatory Visit: Payer: Self-pay

## 2019-07-08 ENCOUNTER — Emergency Department
Admission: EM | Admit: 2019-07-08 | Discharge: 2019-07-08 | Disposition: A | Discharge status: Home / Self Care | Payer: Medicaid Other | Attending: Emergency Medicine | Admitting: Emergency Medicine

## 2019-07-08 ENCOUNTER — Encounter: Payer: Self-pay | Admitting: Emergency Medicine

## 2019-07-08 ENCOUNTER — Emergency Department: Payer: Medicaid Other

## 2019-07-08 DIAGNOSIS — Z3A01 Less than 8 weeks gestation of pregnancy: Secondary | ICD-10-CM | POA: Insufficient documentation

## 2019-07-08 DIAGNOSIS — O208 Other hemorrhage in early pregnancy: Secondary | ICD-10-CM | POA: Diagnosis not present

## 2019-07-08 DIAGNOSIS — R102 Pelvic and perineal pain: Secondary | ICD-10-CM | POA: Insufficient documentation

## 2019-07-08 DIAGNOSIS — Z87891 Personal history of nicotine dependence: Secondary | ICD-10-CM | POA: Diagnosis not present

## 2019-07-08 DIAGNOSIS — Z3491 Encounter for supervision of normal pregnancy, unspecified, first trimester: Secondary | ICD-10-CM

## 2019-07-08 DIAGNOSIS — O468X1 Other antepartum hemorrhage, first trimester: Secondary | ICD-10-CM

## 2019-07-08 DIAGNOSIS — O418X10 Other specified disorders of amniotic fluid and membranes, first trimester, not applicable or unspecified: Secondary | ICD-10-CM

## 2019-07-08 DIAGNOSIS — O219 Vomiting of pregnancy, unspecified: Secondary | ICD-10-CM | POA: Diagnosis present

## 2019-07-08 LAB — COMPREHENSIVE METABOLIC PANEL
ALT: 17 U/L (ref 0–44)
AST: 16 U/L (ref 15–41)
Albumin: 4.4 g/dL (ref 3.5–5.0)
Alkaline Phosphatase: 28 U/L — ABNORMAL LOW (ref 38–126)
Anion gap: 10 (ref 5–15)
BUN: 13 mg/dL (ref 6–20)
CO2: 22 mmol/L (ref 22–32)
Calcium: 9.3 mg/dL (ref 8.9–10.3)
Chloride: 104 mmol/L (ref 98–111)
Creatinine, Ser: 0.67 mg/dL (ref 0.44–1.00)
GFR calc Af Amer: >60 mL/min (ref >60)
GFR calc non Af Amer: >60 mL/min (ref >60)
Glucose, Bld: 86 mg/dL (ref 70–99)
Potassium: 4.1 mmol/L (ref 3.5–5.1)
Sodium: 136 mmol/L (ref 135–145)
Total Bilirubin: 0.7 mg/dL (ref 0.3–1.2)
Total Protein: 7.2 g/dL (ref 6.5–8.1)

## 2019-07-08 LAB — URINALYSIS, COMPLETE (UACMP) WITH MICROSCOPIC
Bilirubin Urine: NEGATIVE
Glucose, UA: NEGATIVE mg/dL
Hgb urine dipstick: NEGATIVE
Ketones, ur: NEGATIVE mg/dL
Nitrite: NEGATIVE
Protein, ur: NEGATIVE mg/dL
Specific Gravity, Urine: 1.023 (ref 1.005–1.030)
pH: 5 (ref 5.0–8.0)

## 2019-07-08 LAB — CBC
HCT: 40 % (ref 36.0–46.0)
Hemoglobin: 14 g/dL (ref 12.0–15.0)
MCH: 30.4 pg (ref 26.0–34.0)
MCHC: 35 g/dL (ref 30.0–36.0)
MCV: 86.8 fL (ref 80.0–100.0)
Platelets: 258 10*3/uL (ref 150–400)
RBC: 4.61 MIL/uL (ref 3.87–5.11)
RDW: 11.4 % — ABNORMAL LOW (ref 11.5–15.5)
WBC: 8.6 10*3/uL (ref 4.0–10.5)
nRBC: 0 % (ref 0.0–0.2)

## 2019-07-08 LAB — ABO/RH: ABO/RH(D): A POS

## 2019-07-08 LAB — HCG, QUANTITATIVE, PREGNANCY: hCG, Beta Chain, Quant, S: 23922 m[IU]/mL — ABNORMAL HIGH (ref <5)

## 2019-07-08 LAB — POCT PREGNANCY, URINE: Preg Test, Ur: POSITIVE — AB

## 2019-07-08 LAB — LIPASE, BLOOD: Lipase: 23 U/L (ref 11–51)

## 2019-07-08 NOTE — ED Triage Notes (Signed)
Pt reports found out she was pregnant recently and for the past few days has felt really nauseated and had a HA. Pt also reports some pain to her left abd since last pm. Pt with hx of 1 miscarriage. Denies vaginal bleeding.

## 2019-07-08 NOTE — ED Notes (Signed)
Pt's discharge papers signed physically and sent to records.

## 2019-07-08 NOTE — ED Notes (Signed)
See triage note  Recently pregnant    Positive nausea  And h/a    Also has been having some abd discomfort   No vaginal  bleeding

## 2019-07-08 NOTE — ED Provider Notes (Addendum)
[facility] Emergency Department Provider Note  ____________________________________________   First MD Initiated Contact with Patient 07/08/19 1636     (approximate)  I have reviewed the triage vital signs and the nursing notes.   HISTORY  Chief Complaint Nausea, Headache, Emesis, and Dizziness    HPI Amanda Austin is a 19 y.o. female G2, P0 (1 previous miscarriage last year) last menstrual period first day May 28, 2019 presents to the emergency department secondary to pelvic discomfort and nausea.  Patient admits to one episode of vomiting yesterday..  Patient states that she found out she was pregnant a week ago and has an upcoming appointment [place] [facility].  Patient denies any vaginal bleeding.        History reviewed. No pertinent past medical history.  There are no problems to display for this patient.   History reviewed. No pertinent surgical history.  Prior to Admission medications   Not on File    Allergies Rocephin [ceftriaxone]  No family history on file.  Social History Social History   Tobacco Use  . Smoking status: Former Research scientist (life sciences)  . Smokeless tobacco: Never Used  Substance Use Topics  . Alcohol use: Never  . Drug use: Never    Review of Systems Constitutional: No fever/chills Eyes: No visual changes. ENT: No sore throat. Cardiovascular: Denies chest pain. Respiratory: Denies shortness of breath. Gastrointestinal: No abdominal pain.  No nausea, no vomiting.  No diarrhea.  No constipation. Genitourinary: Positive for pelvic pain Musculoskeletal: Negative for neck pain.  Negative for back pain. Integumentary: Negative for rash. Neurological: Negative for headaches, focal weakness or numbness.   ____________________________________________   PHYSICAL EXAM:  VITAL SIGNS: ED Triage Vitals [07/08/19 1522]  Enc Vitals Group     BP 121/76     Pulse Rate 87     Resp 20     Temp 99 F (37.2 C)     Temp  Source Oral     SpO2 100 %     Weight 52.2 kg (115 lb)     Height 1.626 m (5\' 4" )     Head Circumference      Peak Flow      Pain Score 7     Pain Loc      Pain Edu?      Excl. in [place]?     Constitutional: Alert and oriented.  Eyes: Conjunctivae are normal.  Mouth/Throat: Patient is wearing a mask. Neck: No stridor.  No meningeal signs.   Cardiovascular: Normal rate, regular rhythm. Good peripheral circulation. Grossly normal heart sounds. Respiratory: Normal respiratory effort.  No retractions. Gastrointestinal: Soft and nontender. No distention.  Musculoskeletal: No lower extremity tenderness nor edema. No gross deformities of extremities. Neurologic:  Normal speech and language. No gross focal neurologic deficits are appreciated.  Skin:  Skin is warm, dry and intact. Psychiatric: Mood and affect are normal. Speech and behavior are normal.  ____________________________________________   LABS (all labs ordered are listed, but only abnormal results are displayed)  Labs Reviewed  COMPREHENSIVE METABOLIC PANEL - Abnormal; Notable for the following components:      Result Value   Alkaline Phosphatase 28 (*)    All other components within normal limits  CBC - Abnormal; Notable for the following components:   RDW 11.4 (*)    All other components within normal limits  URINALYSIS, COMPLETE (UACMP) WITH MICROSCOPIC - Abnormal; Notable for the following components:   Color, Urine YELLOW (*)    APPearance CLOUDY (*)  Leukocytes,Ua SMALL (*)    Bacteria, UA RARE (*)    All other components within normal limits  HCG, QUANTITATIVE, PREGNANCY - Abnormal; Notable for the following components:   hCG, Beta Chain, Quant, S 23,922 (*)    All other components within normal limits  POCT PREGNANCY, URINE - Abnormal; Notable for the following components:   Preg Test, Ur POSITIVE (*)    All other components within normal limits  LIPASE, BLOOD  POC URINE PREG, ED  ABO/RH    ____________________________________________  EKG  ED ECG REPORT I, Whitelaw N Katira Dumais, the attending physician, personally viewed and interpreted this ECG.   Date: 07/08/2019  EKG Time: 3:22 PM  Rate: 70  Rhythm: Normal sinus rhythm  Axis: Rightward axis  Intervals: Normal  ST&T Change: None  ____________________________________________  RADIOLOGY I, Brenton N Oluwadara Gorman, personally viewed and evaluated these images (plain radiographs) as part of my medical decision making, as well as reviewing the written report by the radiologist.  ED MD interpretation: Single intrauterine gestational sac measuring 5 weeks 5 days on ultrasound per radiologist also noted a small to moderate subchorionic hemorrhage  Official radiology report(s): US OB Comp Less 14 Wks  Result Date: 07/08/2019 CLINICAL DATA:  Pelvic pain for 1 day. Gestational age by LMP of 5 weeks 6 days. EXAM: OBSTETRIC <14 WK ULTRASOUND TECHNIQUE: Transabdominal ultrasound was performed for evaluation of the gestation as well as the maternal uterus and adnexal regions. Patient declined transvaginal sonography. COMPARISON:  None. FINDINGS: Intrauterine gestational sac: Single Yolk sac:  Visualized. Embryo:  Not Visualized. MSD:  10 mm   5 w   5 d Subchorionic hemorrhage: A small to moderate subchorionic hemorrhage is seen. Maternal uterus/adnexae: No fibroids identified. Small right ovarian corpus luteum is noted. Normal appearance of left ovary. No adnexal mass or abnormal free fluid identified. IMPRESSION: Single intrauterine gestational sac measuring 5 weeks 5 days by mean sac diameter. Suggest correlation with serial b-hCG levels, and consider followup ultrasound to assess viability in 10 days. Small to moderate subchorionic hemorrhage. Electronically Signed   By: Danae Orleans M.D.   On: 07/08/2019 18:07     Procedures   ____________________________________________   INITIAL IMPRESSION / MDM / ASSESSMENT AND PLAN / ED  COURSE  As part of my medical decision making, I reviewed the following data within the electronic MEDICAL RECORD NUMBER  19 year old female presented with above-stated history and physical exam.  OB ultrasound revealed small to moderate subchorionic hemorrhage however single intrauterine gestational sac measuring 5 weeks 5 days.  Laboratory data including urinalysis unremarkable.  Patient referred to her noted clinic OB/GYN Dr. Elesa Massed for further outpatient valuation. ____________________________________________  FINAL CLINICAL IMPRESSION(S) / ED DIAGNOSES  Final diagnoses:  Pelvic pain  First trimester pregnancy  Subchorionic hematoma in first trimester, single or unspecified fetus     MEDICATIONS GIVEN DURING THIS VISIT:  Medications - No data to display   ED Discharge Orders    None      *Please note:  Amanda Austin was evaluated in Emergency Department on 07/08/2019 for the symptoms described in the history of present illness. She was evaluated in the context of the global COVID-19 pandemic, which necessitated consideration that the patient might be at risk for infection with the SARS-CoV-2 virus that causes COVID-19. Institutional protocols and algorithms that pertain to the evaluation of patients at risk for COVID-19 are in a state of rapid change based on information released by regulatory bodies including the CDC and federal  and state organizations. These policies and algorithms were followed during the patient's care in the ED.  Some ED evaluations and interventions may be delayed as a result of limited staffing during the pandemic.*  Note:  This document was prepared using Dragon voice recognition software and may include unintentional dictation errors.   Darci Current, MD 07/08/19 1958    Darci Current, MD 07/08/19 1958

## 2019-07-14 ENCOUNTER — Other Ambulatory Visit: Payer: Self-pay

## 2019-07-14 ENCOUNTER — Ambulatory Visit (LOCAL_COMMUNITY_HEALTH_CENTER): Payer: Medicaid Other

## 2019-07-14 VITALS — BP 109/66 | Ht 64.0 in | Wt 113.5 lb

## 2019-07-14 DIAGNOSIS — Z3201 Encounter for pregnancy test, result positive: Secondary | ICD-10-CM | POA: Diagnosis not present

## 2019-07-14 LAB — PREGNANCY, URINE: Preg Test, Ur: POSITIVE — AB

## 2019-07-14 MED ORDER — PRENATAL VITAMIN 27-0.8 MG PO TABS: 1.0000 | ORAL_TABLET | Freq: Every day | ORAL | 0 refills | Status: AC

## 2019-07-14 NOTE — Progress Notes (Signed)
Pt states she is unsure of where she wants to go for prenatal care; sent to preadmit.

## 2020-02-21 IMAGING — US US OB COMP LESS 14 WK
1 series · 14 of 28 positions shown · non-contrast
Comparison: None.

CLINICAL DATA: Pelvic pain for 1 day. Gestational age by LMP of 5
weeks 6 days.

EXAM:
OBSTETRIC <14 WK ULTRASOUND
TECHNIQUE: Transabdominal ultrasound was performed for evaluation of the
gestation as well as the maternal uterus and adnexal regions.
Patient declined transvaginal sonography.

[Series 1: us ob comp less 14 wk · 14 of 46 slices shown]
[im 2/46]
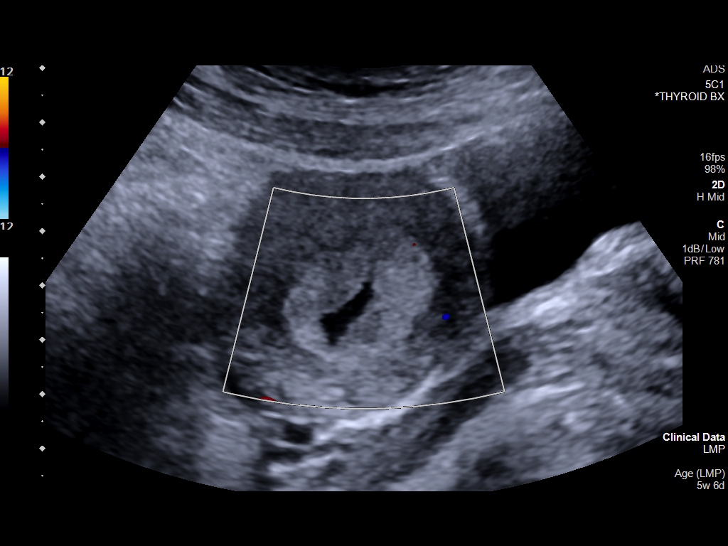
[im 6/46]
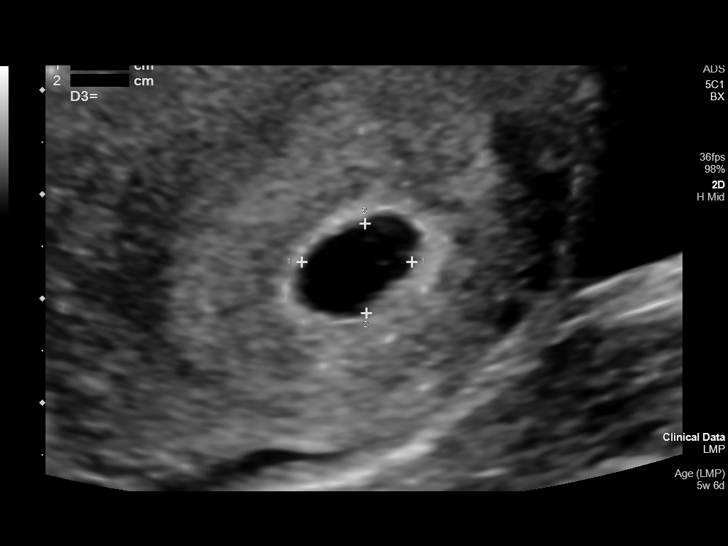
[im 9/46]
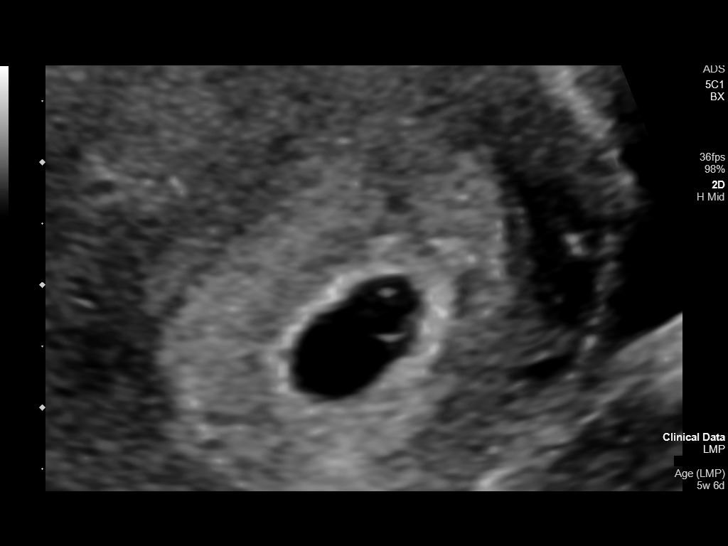
[im 12/46]
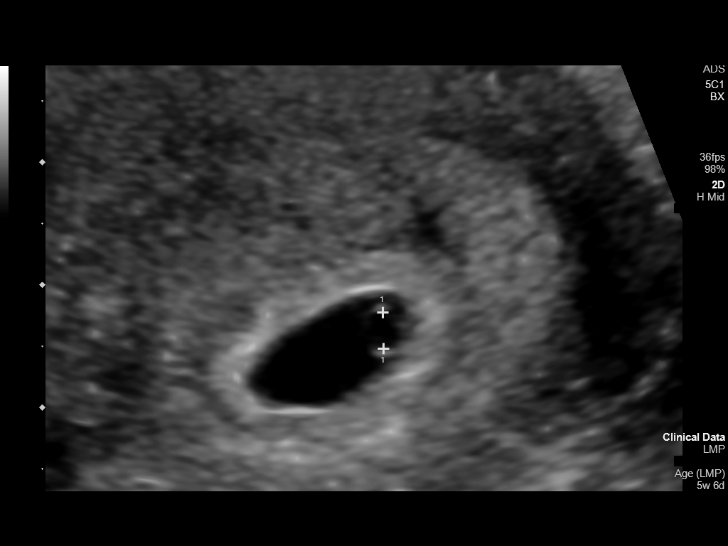
[im 16/46]
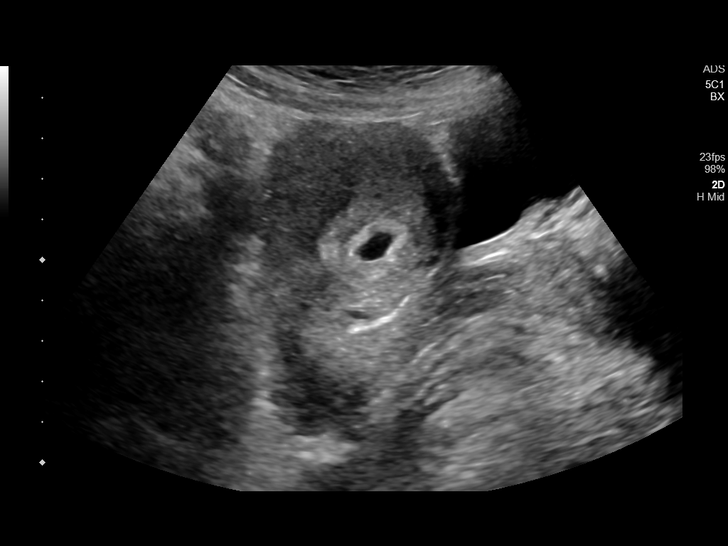
[im 19/46]
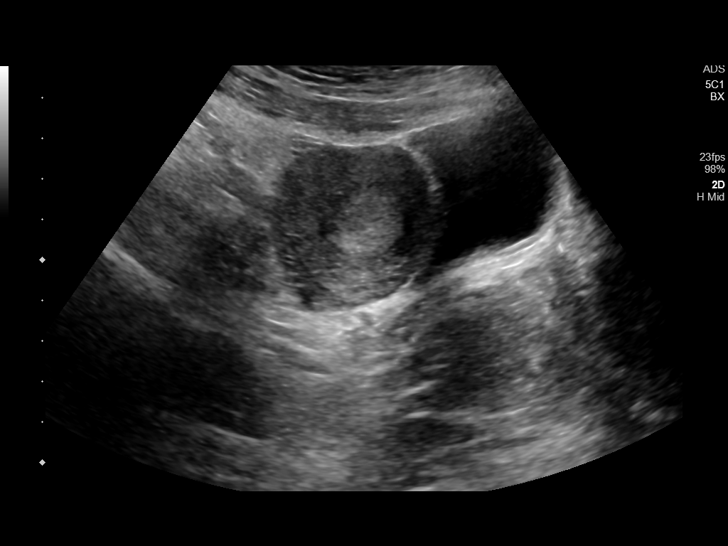
[im 22/46]
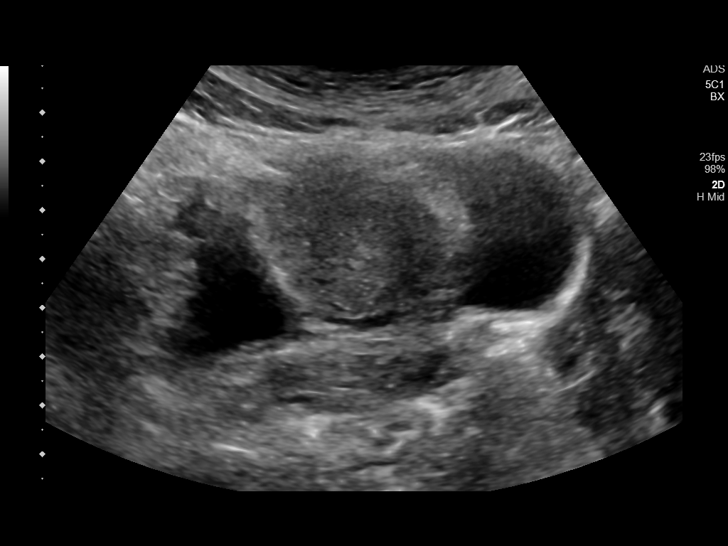
[im 26/46]
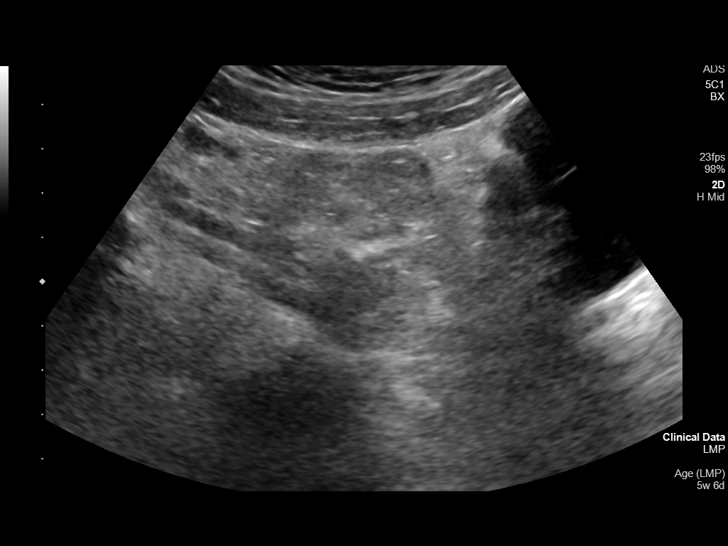
[im 29/46]
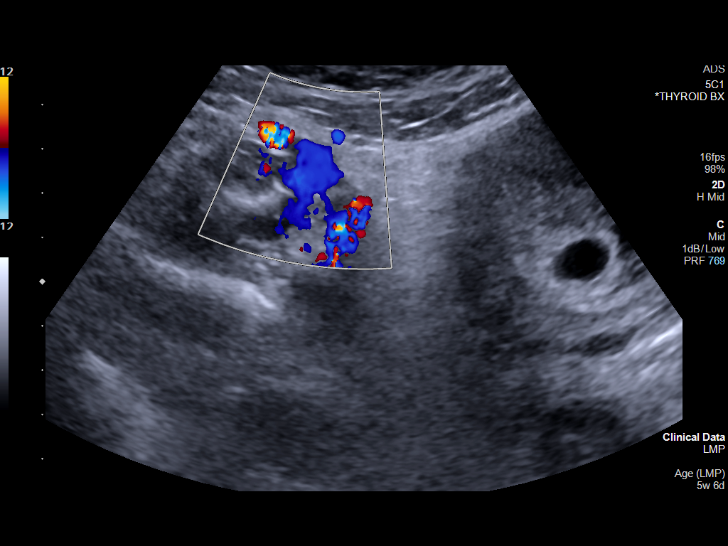
[im 32/46]
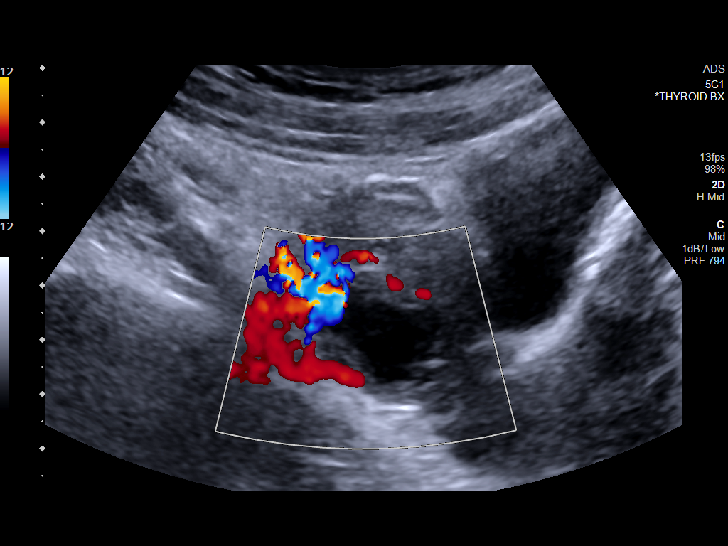
[im 36/46]
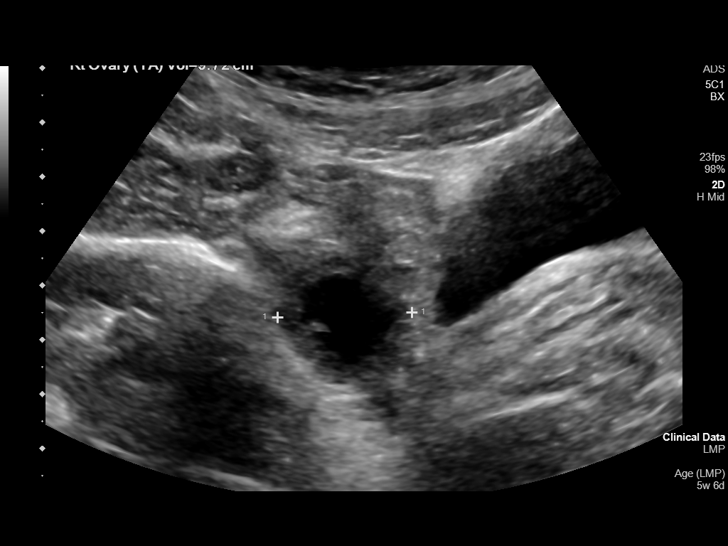
[im 39/46]
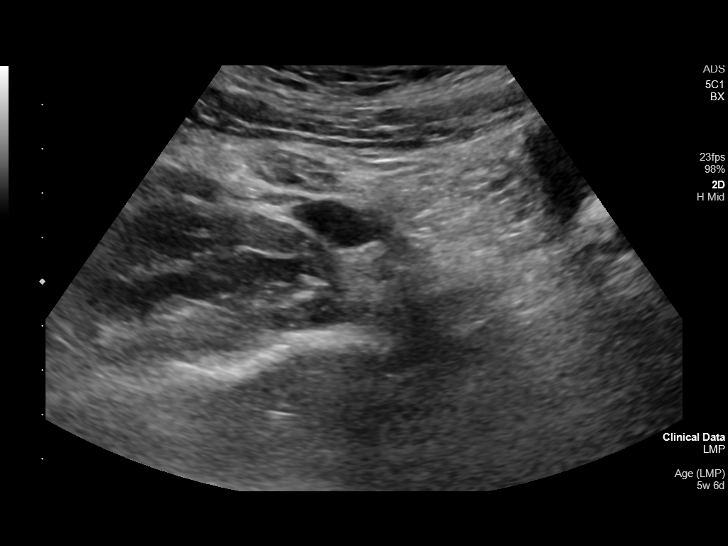
[im 42/46]
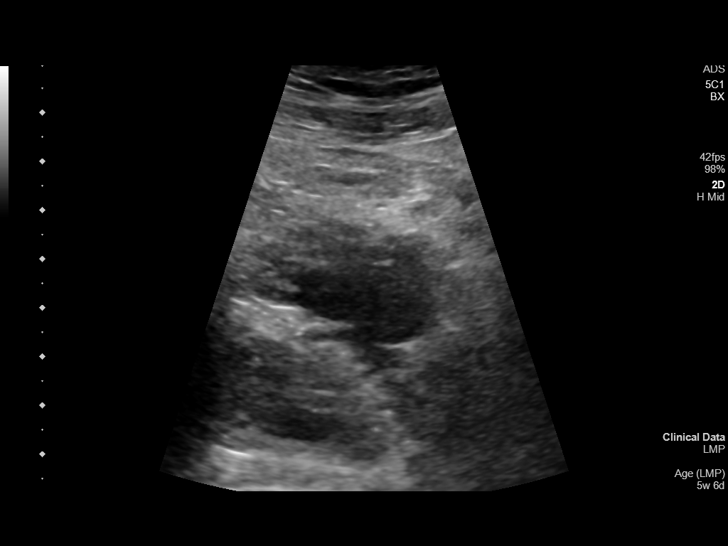
[im 46/46]
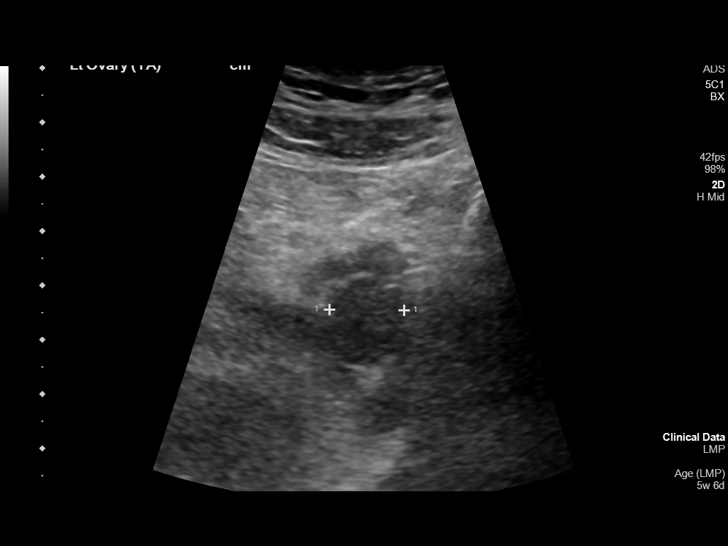

[14 of 28 positions shown; findings below may reference images not displayed]

FINDINGS: Intrauterine gestational sac: Single

Yolk sac:  Visualized.

Embryo:  Not Visualized.

MSD:  10 mm   5 w   5 d

Subchorionic hemorrhage: A small to moderate subchorionic hemorrhage
is seen.

Maternal uterus/adnexae: No fibroids identified. Small right ovarian
corpus luteum is noted. Normal appearance of left ovary. No adnexal
mass or abnormal free fluid identified.
IMPRESSION: Single intrauterine gestational sac measuring 5 weeks 5 days by mean
sac diameter. Suggest correlation with serial b-hCG levels, and
consider followup ultrasound to assess viability in 10 days.

Small to moderate subchorionic hemorrhage.

## 2020-05-06 ENCOUNTER — Emergency Department
Admission: EM | Admit: 2020-05-06 | Discharge: 2020-05-06 | Discharge status: Against Medical Advice | Payer: Medicaid Other

## 2020-05-06 NOTE — ED Notes (Signed)
Called patient without response. This RN walked through lobby, outside and into [employer] calling for patient.
